# Patient Record
Sex: Male | Born: 1935 | Race: Black or African American | Hispanic: No | Marital: Married | Smoking: Never smoker
Health system: Southern US, Community
[De-identification: ages and names within clinical notes are randomized; demographics above are authoritative.]

## PROBLEM LIST (undated history)

## (undated) DIAGNOSIS — I4891 Unspecified atrial fibrillation: Secondary | ICD-10-CM

## (undated) DIAGNOSIS — J90 Pleural effusion, not elsewhere classified: Secondary | ICD-10-CM

## (undated) DIAGNOSIS — C329 Malignant neoplasm of larynx, unspecified: Secondary | ICD-10-CM

## (undated) DIAGNOSIS — F0150 Vascular dementia without behavioral disturbance: Secondary | ICD-10-CM

## (undated) DIAGNOSIS — K5790 Diverticulosis of intestine, part unspecified, without perforation or abscess without bleeding: Secondary | ICD-10-CM

## (undated) DIAGNOSIS — D696 Thrombocytopenia, unspecified: Secondary | ICD-10-CM

## (undated) DIAGNOSIS — I509 Heart failure, unspecified: Secondary | ICD-10-CM

## (undated) DIAGNOSIS — I2510 Atherosclerotic heart disease of native coronary artery without angina pectoris: Secondary | ICD-10-CM

## (undated) DIAGNOSIS — E870 Hyperosmolality and hypernatremia: Secondary | ICD-10-CM

## (undated) DIAGNOSIS — K2970 Gastritis, unspecified, without bleeding: Secondary | ICD-10-CM

## (undated) DIAGNOSIS — I10 Essential (primary) hypertension: Secondary | ICD-10-CM

## (undated) DIAGNOSIS — D649 Anemia, unspecified: Secondary | ICD-10-CM

---

## 2007-04-28 ENCOUNTER — Ambulatory Visit: Payer: Self-pay | Admitting: Unknown Physician Specialty

## 2013-04-12 ENCOUNTER — Ambulatory Visit: Payer: Self-pay | Admitting: Family Medicine

## 2015-11-04 ENCOUNTER — Encounter: Payer: Self-pay | Admitting: Emergency Medicine

## 2015-11-04 ENCOUNTER — Emergency Department: Payer: Medicare Other

## 2015-11-04 ENCOUNTER — Emergency Department
Admission: EM | Admit: 2015-11-04 | Discharge: 2015-11-04 | Disposition: A | Discharge status: Home / Self Care | Payer: Medicare Other | Attending: Emergency Medicine | Admitting: Emergency Medicine

## 2015-11-04 DIAGNOSIS — S6992XA Unspecified injury of left wrist, hand and finger(s), initial encounter: Secondary | ICD-10-CM | POA: Diagnosis present

## 2015-11-04 DIAGNOSIS — X58XXXA Exposure to other specified factors, initial encounter: Secondary | ICD-10-CM | POA: Diagnosis not present

## 2015-11-04 DIAGNOSIS — Y998 Other external cause status: Secondary | ICD-10-CM | POA: Insufficient documentation

## 2015-11-04 DIAGNOSIS — I10 Essential (primary) hypertension: Secondary | ICD-10-CM | POA: Diagnosis not present

## 2015-11-04 DIAGNOSIS — Y9289 Other specified places as the place of occurrence of the external cause: Secondary | ICD-10-CM | POA: Diagnosis not present

## 2015-11-04 DIAGNOSIS — Y9389 Activity, other specified: Secondary | ICD-10-CM | POA: Insufficient documentation

## 2015-11-04 DIAGNOSIS — S68115A Complete traumatic metacarpophalangeal amputation of left ring finger, initial encounter: Secondary | ICD-10-CM

## 2015-11-04 DIAGNOSIS — F0390 Unspecified dementia without behavioral disturbance: Secondary | ICD-10-CM | POA: Diagnosis not present

## 2015-11-04 DIAGNOSIS — S68125A Partial traumatic metacarpophalangeal amputation of left ring finger, initial encounter: Secondary | ICD-10-CM | POA: Insufficient documentation

## 2015-11-04 DIAGNOSIS — S61219A Laceration without foreign body of unspecified finger without damage to nail, initial encounter: Secondary | ICD-10-CM

## 2015-11-04 HISTORY — DX: Essential (primary) hypertension: I10

## 2015-11-04 HISTORY — DX: Anemia, unspecified: D64.9

## 2015-11-04 HISTORY — DX: Atherosclerotic heart disease of native coronary artery without angina pectoris: I25.10

## 2015-11-04 HISTORY — DX: Vascular dementia, unspecified severity, without behavioral disturbance, psychotic disturbance, mood disturbance, and anxiety: F01.50

## 2015-11-04 HISTORY — DX: Gastritis, unspecified, without bleeding: K29.70

## 2015-11-04 HISTORY — DX: Diverticulosis of intestine, part unspecified, without perforation or abscess without bleeding: K57.90

## 2015-11-04 HISTORY — DX: Thrombocytopenia, unspecified: D69.6

## 2015-11-04 HISTORY — DX: Malignant neoplasm of larynx, unspecified: C32.9

## 2015-11-04 HISTORY — DX: Hyperosmolality and hypernatremia: E87.0

## 2015-11-04 HISTORY — DX: Pleural effusion, not elsewhere classified: J90

## 2015-11-04 HISTORY — DX: Unspecified atrial fibrillation: I48.91

## 2015-11-04 HISTORY — DX: Heart failure, unspecified: I50.9

## 2015-11-04 MED ORDER — LIDOCAINE HCL (PF) 1 % IJ SOLN
INTRAMUSCULAR | Status: AC
  Administered 2015-11-04: 22:00:00
  Filled 2015-11-04: qty 10

## 2015-11-04 MED ORDER — TRAMADOL HCL 50 MG PO TABS: 50.0000 mg | ORAL_TABLET | Freq: Four times a day (QID) | ORAL | Status: AC | PRN

## 2015-11-04 MED ORDER — TRAMADOL HCL 50 MG PO TABS: 50.0000 mg | ORAL_TABLET | Freq: Once | ORAL | Status: DC

## 2015-11-04 NOTE — ED Notes (Addendum)
Per EMS pt presents with an amputation to the L ring finger and laceration to L middle finger. Per EMS bleeding controlled when they arrived on scene. EMS brought amputated piece of finger in a baggie with ice and water in it. Pt has no complaints of pain at this time. Pt presents from [facility] at this time. Fingers are wrapped at this time.

## 2015-11-04 NOTE — ED Notes (Signed)
This RN spoke with family about transport back to [facility]. Patient's family states that "they are responsible for getting him back". This RN called Radiation protection practitioner and was told by [employer], RN that they do not do transport back to the facility. This RN notified pt's family that [facility] would not be able to provide transport. Pt's wife states "I'm not taking him back, they did this, they are responsible for bringing him back". Pt's family notified that non-emergency transport would be billed to the patient and it would be their responsibility to follow up with [facility]. Pt's family requests non-emergency transport at this time.

## 2015-11-04 NOTE — Discharge Instructions (Signed)
Follow-up with orthopedic clinic for definitive treatment.

## 2015-11-04 NOTE — ED Notes (Signed)
Report given to Crystal, RN, D/C instructions reviewed with patient's wife at this time. Pt's wife denies comments/concerns at this time. Pt to be taken back to [facility] via EMS at this time.

## 2015-11-04 NOTE — ED Notes (Signed)
Pt's family tried to take patient's finger in a bag of ice. This RN explained to patient's family that finger was now considered a biohazard and we could no release tip of patient's finger to them. Pt's family states understanding at this time.

## 2015-11-04 NOTE — ED Notes (Signed)
Report called to Crystal, RN

## 2015-11-04 NOTE — ED Provider Notes (Signed)
[facility] Emergency Department Provider Note  ____________________________________________  Time seen: Approximately 7:42 PM  I have reviewed the triage vital signs and the nursing notes.   HISTORY  Chief Complaint Hand Injury   HPI Blake Ingram is a 82 y.o. male patient has amputation to the distal phalanges of the left fourth finger. Patient also has laceration to the left third finger. EMS arrived and dictated finger in a bag of ice. Patient is no complaint this time but is noted that he has a history of dementia. Fingers are rather prior to arrival. Patient has assisted living the nursing home.Patient again is rating his pain as 0.   Past Medical History  Diagnosis Date  . Anemia   . Diverticulosis   . Gastritis   . 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 Thrombocytopenia ([place])   . CHF (congestive heart failure) ([place])   . Atherosclerotic heart disease   . Hypertension   . Hyperosmolality and hypernatremia   . Pleural effusion   . Atrial fibrillation ([place])   . Vascular dementia without behavioral disturbance     There are no active problems to display for this patient.   History reviewed. No pertinent past surgical history.  Current Outpatient Rx  Name  Route  Sig  Dispense  Refill  . traMADol (ULTRAM) 50 MG tablet   Oral   Take 1 tablet (50 mg total) by mouth every 6 (six) hours as needed.   20 tablet   0     Allergies Lisinopril and Shellfish allergy  History reviewed. No pertinent family history.  Social History Social History  Substance Use Topics  . Smoking status: Never Smoker   . Smokeless tobacco: Never Used  . Alcohol Use: Yes     Comment: "very little"    Review of Systems Constitutional: No fever/chills Eyes: No visual changes. ENT: No sore throat. Cardiovascular: Denies chest pain. Respiratory: Denies shortness of breath. Gastrointestinal: No abdominal pain.  No nausea, no vomiting.  No diarrhea.  No  constipation. Genitourinary: Negative for dysuria. Musculoskeletal: Negative for back pain. Skin: Negative for rash. Neurological: Negative for headaches, focal weakness or numbness. Psychiatric:Dementia  Endocrine:Hypertension Hematological/Lymphatic: Allergic/Immunilogical: Lisinopril and shellfish 10-point ROS otherwise negative.  ____________________________________________   PHYSICAL EXAM:  VITAL SIGNS: ED Triage Vitals  Enc Vitals Group     BP 11/04/15 1922 160/132 mmHg     Pulse Rate 11/04/15 1922 52     Resp 11/04/15 1922 18     Temp 11/04/15 1922 98.6 F (37 C)     Temp Source 11/04/15 1922 Oral     SpO2 11/04/15 1922 98 %     Weight 11/04/15 1922 150 lb (68.04 kg)     Height 11/04/15 1922 5\' 7"  (1.702 m)     Head Cir --      Peak Flow --      Pain Score 11/04/15 1905 0     Pain Loc --      Pain Edu? --      Excl. in [place]? --     Constitutional: Alert and oriented. Well appearing and in no acute distress. Eyes: Conjunctivae are normal. PERRL. EOMI. Head: Atraumatic. Nose: No congestion/rhinnorhea. Mouth/Throat: Mucous membranes are moist.  Oropharynx non-erythematous. Neck: No stridor. No cervical spine tenderness to palpation. Hematological/Lymphatic/Immunilogical: No cervical lymphadenopathy. Cardiovascular: Normal rate, regular rhythm. Grossly normal heart sounds.  Good peripheral circulation. Respiratory: Normal respiratory effort.  No retractions. Lungs CTAB. Gastrointestinal: Soft and nontender. No  distention. No abdominal bruits. No CVA tenderness. Musculoskeletal: Amputated distal fourth digit left hand.  Neurologic:  Normal speech and language. No gross focal neurologic deficits are appreciated. No gait instability. Skin:  Skin is warm, dry and intact. No rash noted. Laceration to the third digit left hand. Psychiatric: Mood and affect are normal. Speech and behavior are normal.  ____________________________________________   LABS (all labs  ordered are listed, but only abnormal results are displayed)  Labs Reviewed - No data to display ____________________________________________  EKG   ____________________________________________  RADIOLOGY  X-ray revealed amputation of the fourth distal fourth phalanx with a 3 mm adjacent density as possible osseous fragment.  I, Sable Feil, personally viewed and evaluated these images (plain radiographs) as part of my medical decision making, as well as reviewing the written report by the radiologist.  ____________________________________________   PROCEDURES  Procedure(s) performed:  LACERATION REPAIR Performed by: Sable Feil Authorized by: Sable Feil Consent: Verbal consent obtained. Risks and benefits: risks, benefits and alternatives were discussed Consent given by: patient Patient identity confirmed: provided demographic data Prepped and Draped in normal sterile fashion Wound explored  Laceration Location: Third digit left hand  Laceration Length: 0.3 cm No Foreign Bodies seen or palpated  Anesthesia: local infiltration  Local anesthetic: lidocaine 1% without epinephrine  Anesthetic total: 4 mL   Irrigation method: syringe Amount of cleaning: standard  Skin closure: Dermabond  Patient tolerance: Patient tolerated the procedure well with no immediate complications.   Critical Care performed: No  ____________________________________________   INITIAL IMPRESSION / ASSESSMENT AND PLAN / ED COURSE  Pertinent labs & imaging results that were available during my care of the patient were reviewed by me and considered in my medical decision making (see chart for details). Discussed x-ray findings with Dr. Sabra Heck orthopedic on call. Dr. Sabra Heck suggests clean dressing had the patient follow-up in his office by calling telephonically tomorrow.  Left distal fourth finger amputation and superficial laceration to the left middle finger on the palmar aspect.  Area was cleaned and hemorrhage and control with Surgicel. Patient finger placed in a tubular gauze dressing. Patient given a prescription for tramadol. Left third finger was closed with Dermabond.  FINAL CLINICAL IMPRESSION(S) / ED DIAGNOSES  Final diagnoses:  Amputation of fourth finger, left, initial encounter  Finger laceration, initial encounter      Sable Feil, PA-C 11/04/15 2122  Nena Polio, MD 11/05/15 [phone]

## 2017-03-10 ENCOUNTER — Emergency Department
Admission: EM | Admit: 2017-03-10 | Discharge: 2017-03-10 | Payer: Medicare Other | Attending: Student in an Organized Health Care Education/Training Program | Admitting: Student in an Organized Health Care Education/Training Program

## 2017-03-10 ENCOUNTER — Emergency Department: Payer: Medicare Other

## 2017-03-10 ENCOUNTER — Encounter: Payer: Self-pay | Admitting: Emergency Medicine

## 2017-03-10 DIAGNOSIS — I2510 Atherosclerotic heart disease of native coronary artery without angina pectoris: Secondary | ICD-10-CM | POA: Diagnosis not present

## 2017-03-10 DIAGNOSIS — I509 Heart failure, unspecified: Secondary | ICD-10-CM

## 2017-03-10 DIAGNOSIS — R0602 Shortness of breath: Secondary | ICD-10-CM | POA: Diagnosis present

## 2017-03-10 DIAGNOSIS — J9621 Acute and chronic respiratory failure with hypoxia: Secondary | ICD-10-CM | POA: Diagnosis not present

## 2017-03-10 DIAGNOSIS — I110 Hypertensive heart disease with heart failure: Secondary | ICD-10-CM | POA: Insufficient documentation

## 2017-03-10 LAB — CBC WITH DIFFERENTIAL/PLATELET
BASOS ABS: 0 10*3/uL (ref 0–0.1)
Basophils Relative: 0 %
Eosinophils Absolute: 0 10*3/uL (ref 0–0.7)
Eosinophils Relative: 0 %
HEMATOCRIT: 25.5 % — AB (ref 40.0–52.0)
HEMOGLOBIN: 8.6 g/dL — AB (ref 13.0–18.0)
Lymphocytes Relative: 6 %
Lymphs Abs: 0.3 10*3/uL — ABNORMAL LOW (ref 1.0–3.6)
MCH: 29.9 pg (ref 26.0–34.0)
MCHC: 33.9 g/dL (ref 32.0–36.0)
MCV: 88.2 fL (ref 80.0–100.0)
MONOS PCT: 15 %
Monocytes Absolute: 0.8 10*3/uL (ref 0.2–1.0)
NEUTROS ABS: 4.1 10*3/uL (ref 1.4–6.5)
NEUTROS PCT: 79 %
PLATELETS: 105 10*3/uL — AB (ref 150–440)
RBC: 2.89 MIL/uL — AB (ref 4.40–5.90)
RDW: 19.3 % — AB (ref 11.5–14.5)
WBC: 5.2 10*3/uL (ref 3.8–10.6)

## 2017-03-10 LAB — BASIC METABOLIC PANEL
Anion gap: 8 (ref 5–15)
BUN: 32 mg/dL — AB (ref 6–20)
CALCIUM: 9.2 mg/dL (ref 8.9–10.3)
CO2: 26 mmol/L (ref 22–32)
CREATININE: 1.18 mg/dL (ref 0.61–1.24)
Chloride: 103 mmol/L (ref 101–111)
GFR calc Af Amer: >60 mL/min (ref >60)
GFR, EST NON AFRICAN AMERICAN: 53 mL/min — AB (ref >60)
GLUCOSE: 111 mg/dL — AB (ref 65–99)
POTASSIUM: 3.2 mmol/L — AB (ref 3.5–5.1)
SODIUM: 137 mmol/L (ref 135–145)

## 2017-03-10 LAB — TROPONIN I: Troponin I: 0.03 ng/mL (ref <0.03)

## 2017-03-10 LAB — BRAIN NATRIURETIC PEPTIDE: B NATRIURETIC PEPTIDE 5: 1836 pg/mL — AB (ref 0.0–100.0)

## 2017-03-10 MED ORDER — POTASSIUM CHLORIDE CRYS ER 20 MEQ PO TBCR
40.0000 meq | EXTENDED_RELEASE_TABLET | Freq: Once | ORAL | Status: AC
Start: 2017-03-10 — End: 2017-03-10
  Administered 2017-03-10: 40 meq via ORAL
  Filled 2017-03-10: qty 2

## 2017-03-10 MED ORDER — FUROSEMIDE 10 MG/ML IJ SOLN
60.0000 mg | Freq: Once | INTRAMUSCULAR | Status: AC
  Administered 2017-03-10: 60 mg via INTRAVENOUS
  Filled 2017-03-10: qty 8

## 2017-03-10 MED ORDER — IPRATROPIUM-ALBUTEROL 0.5-2.5 (3) MG/3ML IN SOLN
3.0000 mL | Freq: Once | RESPIRATORY_TRACT | Status: AC
  Administered 2017-03-10: 3 mL via RESPIRATORY_TRACT
  Filled 2017-03-10: qty 3

## 2017-03-10 NOTE — ED Provider Notes (Signed)
[facility] Emergency Department Provider Note    First MD Initiated Contact with Patient 03/10/17 1753     (approximate)  I have reviewed the triage vital signs and the nursing notes.   HISTORY  Chief Complaint Shortness of Breath   Level V Caveat: vascular dementia  HPI QUANTAVIS OBRYANT is a 83 y.o. male presents from [facility] with a history of dementia as well as congestive heart failure due to complaint of persistent desaturations on his home 2 L nasal cannula. Patient is reportedly a DO NOT RESUSCITATE with a history of laryngeal cancer as well as A. fib and anemia. Arrival to the ER the patient denies any complaints and is uncertain as to why he was transferred to the ER. EMS provided digital history suggesting that the patient has had reportedly increasing cough that is productive as well as nursing home reporting desaturations intermittently down to the low 80s. Patient was afebrile. They did give him a dose of 40 mg of by mouth Lasix without any improvement.   Past Medical History:  Diagnosis Date  . Anemia   . Atherosclerotic heart disease   . Atrial fibrillation ([place])   . CHF (congestive heart failure) ([place])   . Diverticulosis   . Gastritis   . Hyperosmolality and hypernatremia   . Hypertension   . 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 Pleural effusion   . Thrombocytopenia ([place])   . Vascular dementia without behavioral disturbance    No family history on file. No past surgical history on file. There are no active problems to display for this patient.     Prior to Admission medications   Not on File    Allergies Lisinopril and Shellfish allergy    Social History Social History  Substance Use Topics  . Smoking status: Never Smoker  . Smokeless tobacco: Never Used  . Alcohol use Yes     Comment: "very little"    Review of Systems Patient denies headaches, rhinorrhea, blurry vision, numbness, shortness of breath,  chest pain, edema, cough, abdominal pain, nausea, vomiting, diarrhea, dysuria, fevers, rashes or hallucinations unless otherwise stated above in HPI. ____________________________________________   PHYSICAL EXAM:  VITAL SIGNS: Vitals:   03/10/17 1759  BP: 138/72  Pulse: 67  Resp: (!) 23  Temp: 97.4 F (36.3 C)    Constitutional: Alert and oriented. Chronically ill appearing but in no acute distress. Eyes: Conjunctivae are normal. PERRL. EOMI. Head: Atraumatic. Nose: No congestion/rhinnorhea. Mouth/Throat: Mucous membranes are moist.  Oropharynx non-erythematous. Neck: No stridor. Painless ROM. No cervical spine tenderness to palpation Hematological/Lymphatic/Immunilogical: No cervical lymphadenopathy. Cardiovascular: Normal rate, regular rhythm. Grossly normal heart sounds.  Good peripheral circulation. Respiratory: mild tachypnea, prolonged expiratory phase, diffuse rhonchorus breath sounds. Gastrointestinal: Soft and nontender. No distention. No abdominal bruits. No CVA tenderness. Musculoskeletal: No lower extremity tenderness.  1+ bilateral edema. No joint effusions. Neurologic:  No gross focal neurologic deficits are appreciated.  Skin:  Skin is warm, dry and intact. No rash noted. Psychiatric: Mood and affect are normal. Speech and behavior are normal. ____________________________________________   LABS (all labs ordered are listed, but only abnormal results are displayed)  No results found for this or any previous visit (from the past 24 hour(s)). ____________________________________________  EKG My review and personal interpretation at Time: 17:59   Indication: sob  Rate: 70  Rhythm: afib Axis: normal Other: non specific st changes with lateral t wave inversions, no STEMI ____________________________________________  RADIOLOGY  I  personally reviewed all radiographic images ordered to evaluate for the above acute complaints and reviewed radiology reports and  findings.  These findings were personally discussed with the patient.  Please see medical record for radiology report.  ____________________________________________   PROCEDURES  Procedure(s) performed:  Procedures    Critical Care performed: no ____________________________________________   INITIAL IMPRESSION / ASSESSMENT AND PLAN / ED COURSE  Pertinent labs & imaging results that were available during my care of the patient were reviewed by me and considered in my medical decision making (see chart for details).  DDX: Asthma, copd, CHF, pna, ptx, malignancy, Pe, anemia   FRISCO CORDTS is a 83 y.o. who presents to the ED with acute hypoxia and shortness of breath as described above.  Patient is AFVSS in ED. Exam as above. Given current presentation have considered the above differential.  Some presentation I am suspicious for worsening congestive heart failure as it does appear that he was recently stopped on his Lasix. The patient will be placed on continuous pulse oximetry and telemetry for monitoring.  Laboratory evaluation will be sent to evaluate for the above complaints.      Clinical Course as of Mar 11 4  Tue Mar 10, 2017  1905 On reassessing patient found him with a oxygenation of 86% on 3 L of nasal cannula with a good waveform and to. Presentation is more consistent with congestive heart failure..  Hemoglobin appears stable based on his previous records. He has no fever and no leukocytosis therefore do not feel this is secondary to pneumonia or sepsis. His mild elevation of his troponin which is likely secondary to worsening heart failure. Do feel patient will cry or admission. Family has requested transfer to the [facility].  [PR]  2300 Patient reassessed and remains hemodynamically stable and appropriate for transfer prior to transfer to the [facility].  [PR]    Clinical Course User Index [PR] Merlyn Lot, MD      ____________________________________________   FINAL CLINICAL IMPRESSION(S) / ED DIAGNOSES  Final diagnoses:  Acute on chronic respiratory failure with hypoxia ([place])  Acute on chronic congestive heart failure, unspecified heart failure type ([place])      NEW MEDICATIONS STARTED DURING THIS VISIT:  New Prescriptions   No medications on file     Note:  This document was prepared using Dragon voice recognition software and may include unintentional dictation errors.    Merlyn Lot, MD 03/11/17 [phone]

## 2017-03-10 NOTE — ED Notes (Signed)
Pt assisted to using a urinal. Pt was able to urinate 300 cc of urine.

## 2017-03-10 NOTE — ED Triage Notes (Signed)
Patient from [facility] via [facility]. Staff reports patient has worsening shortness of breath for several days. Denies fever. Reports several instances of oxygen dropping to low 80's. Also reports non-productive cough. Per EMS, patient was given 40 mg lasix IM, with no increased output. Patient has history of CHF and afib. Patient wears 2L at baseline. Upon arrival, patient has increased work of breathing and congested cough. Patient oriented at baseline.

## 2017-10-04 IMAGING — CR DG CHEST 2V
2 series · 2 of 2 positions shown · non-contrast
Comparison: None.

CLINICAL DATA: Acute hypoxia. Shortness of breath for a few days.
Productive cough. History of CHF.

EXAM:
CHEST  2 VIEW

[chest lat]
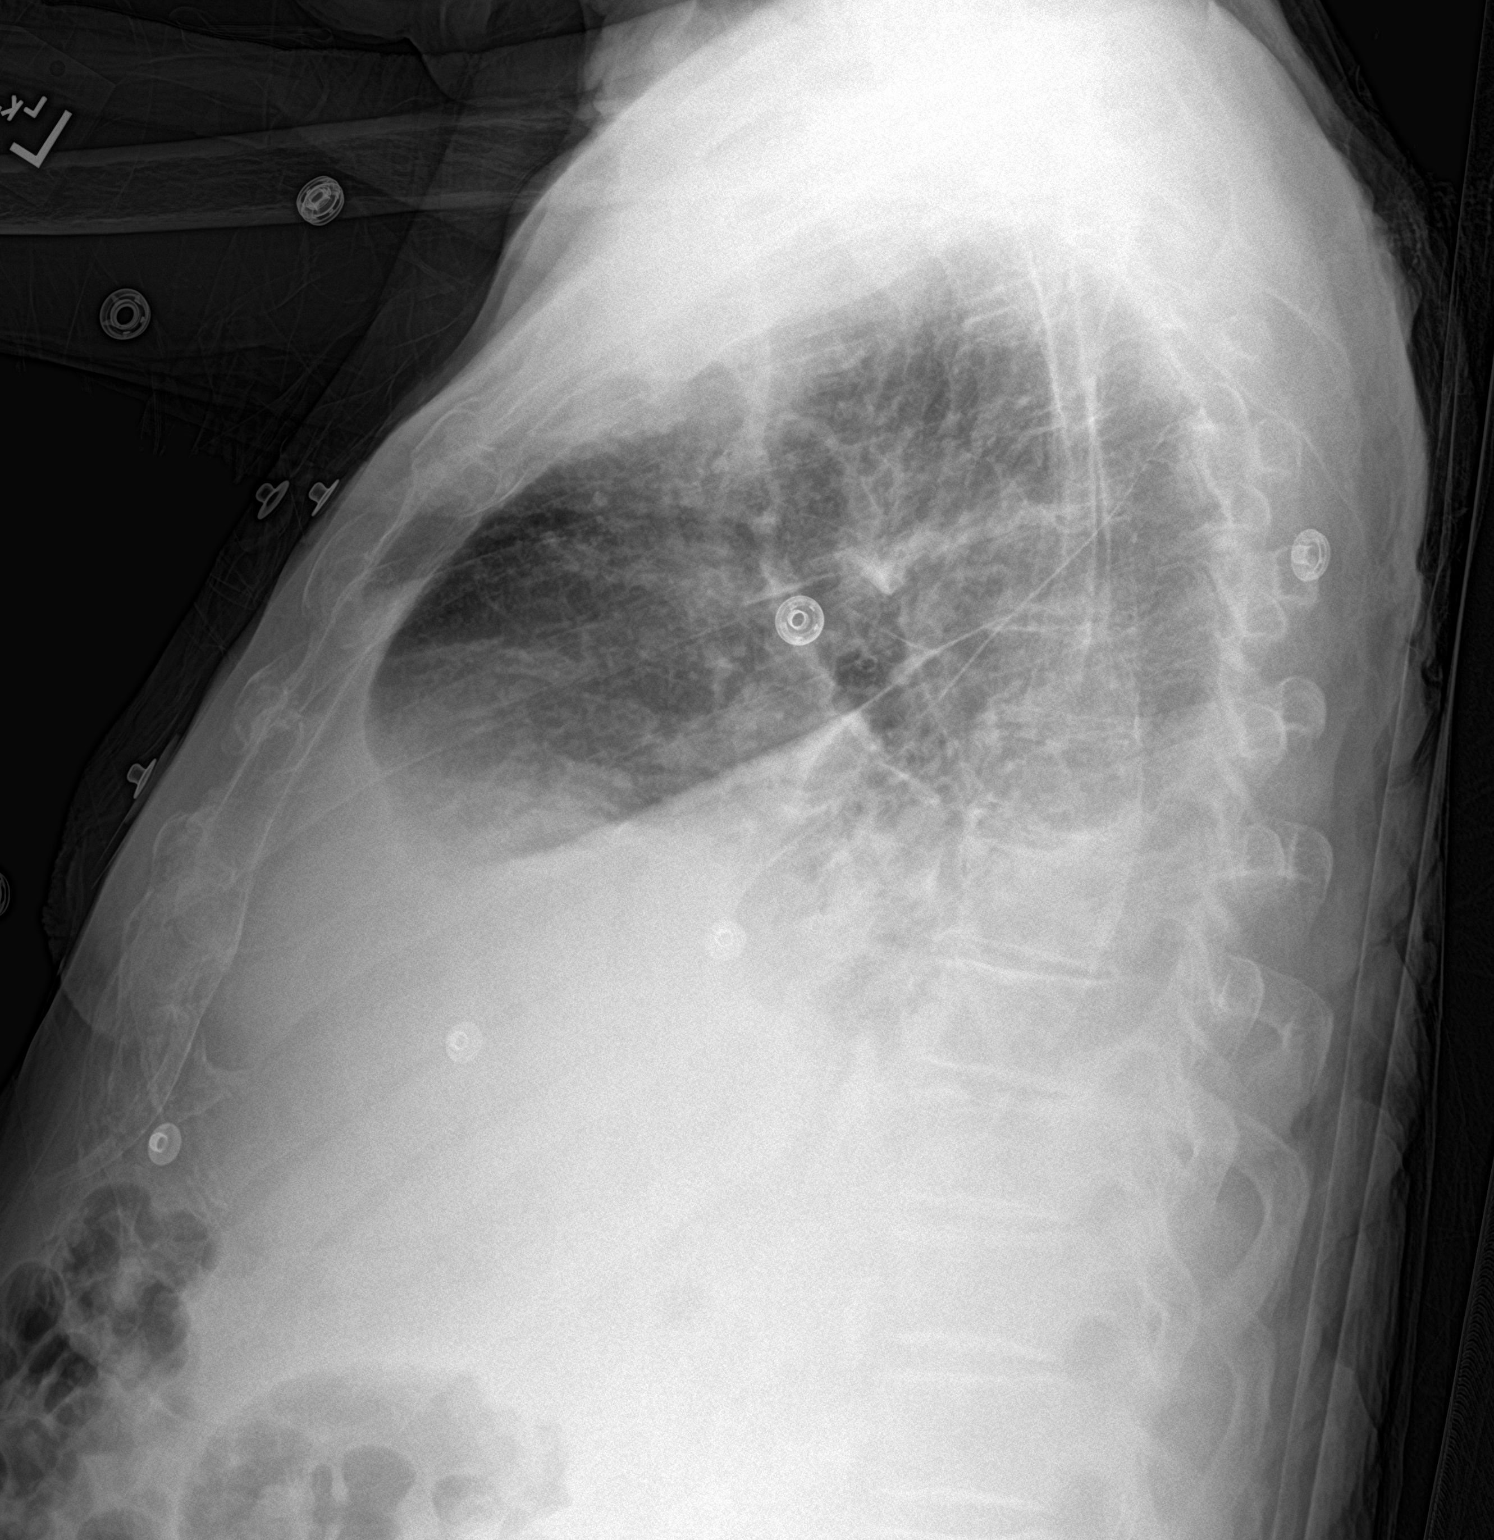

[chest ap]
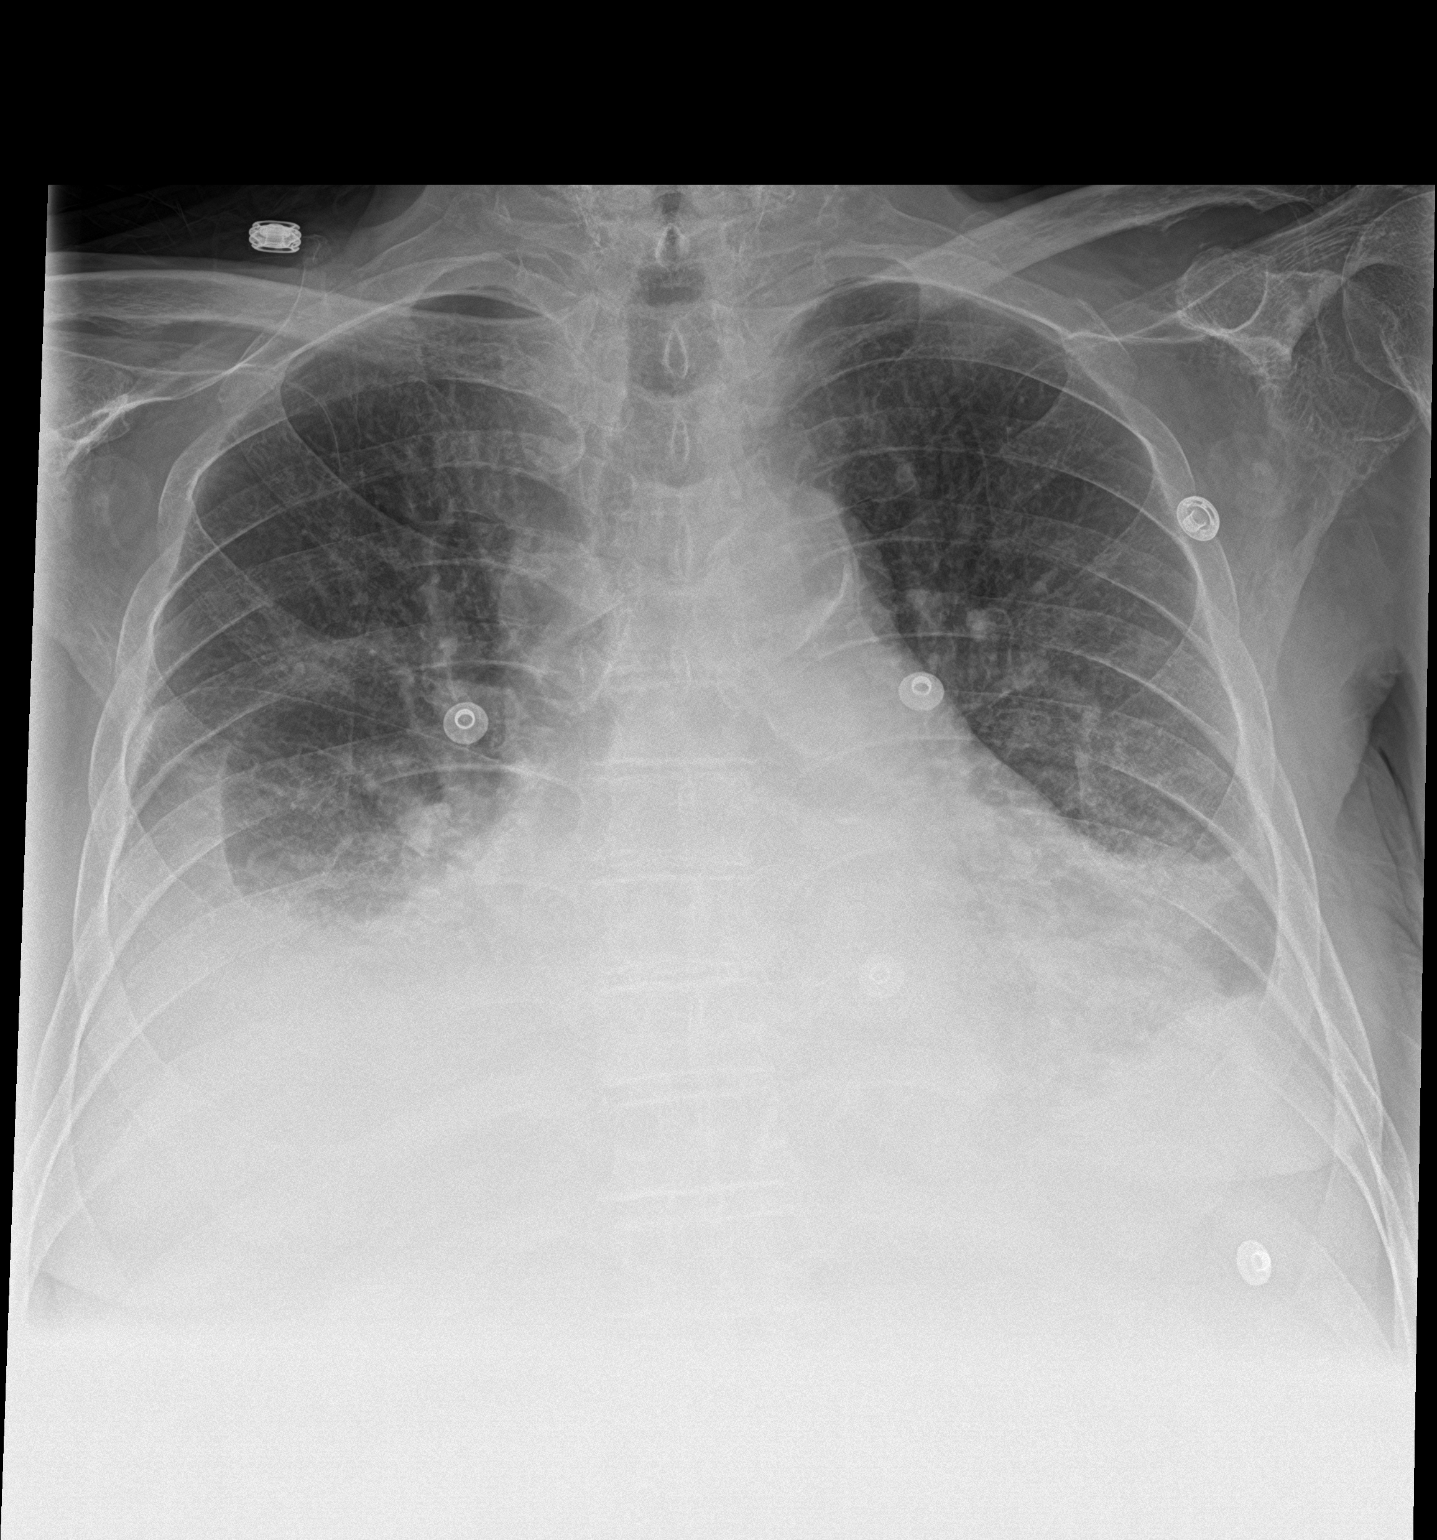

[2 of 2 positions shown; findings below may reference images not displayed]

FINDINGS: The cardiac silhouette is partially obscured but appears enlarged.
Aortic atherosclerosis is noted. The lungs are hypoinflated with
mild pulmonary vascular congestion and diffuse interstitial
accentuation. There are small to moderate right and small left
pleural effusions. No pneumothorax is identified. No acute osseous
abnormality is seen.
IMPRESSION: Cardiomegaly with findings of interstitial edema and right larger
than left pleural effusions.

## 2017-10-20 DEATH — deceased
